# Patient Record
Sex: Female | Born: 1960 | Race: White | Hispanic: No | Marital: Married
Health system: Southern US, Community
[De-identification: ages and names within clinical notes are randomized; demographics above are authoritative.]

---

## 2013-07-18 ENCOUNTER — Other Ambulatory Visit: Payer: Self-pay | Admitting: Family Medicine

## 2013-07-18 ENCOUNTER — Ambulatory Visit (INDEPENDENT_AMBULATORY_CARE_PROVIDER_SITE_OTHER): Payer: BC Managed Care – PPO

## 2013-07-18 DIAGNOSIS — M25539 Pain in unspecified wrist: Secondary | ICD-10-CM

## 2013-07-18 DIAGNOSIS — M79643 Pain in unspecified hand: Secondary | ICD-10-CM

## 2013-07-18 DIAGNOSIS — M79609 Pain in unspecified limb: Secondary | ICD-10-CM

## 2013-08-23 ENCOUNTER — Other Ambulatory Visit: Payer: Self-pay | Admitting: Family Medicine

## 2013-08-23 DIAGNOSIS — Z139 Encounter for screening, unspecified: Secondary | ICD-10-CM

## 2013-08-26 ENCOUNTER — Other Ambulatory Visit: Payer: Self-pay | Admitting: Family Medicine

## 2013-08-26 DIAGNOSIS — Z1231 Encounter for screening mammogram for malignant neoplasm of breast: Secondary | ICD-10-CM

## 2013-09-03 ENCOUNTER — Ambulatory Visit: Payer: BC Managed Care – PPO

## 2013-09-03 ENCOUNTER — Ambulatory Visit (INDEPENDENT_AMBULATORY_CARE_PROVIDER_SITE_OTHER): Payer: BC Managed Care – PPO

## 2013-09-03 DIAGNOSIS — M899 Disorder of bone, unspecified: Secondary | ICD-10-CM

## 2013-09-03 DIAGNOSIS — Z139 Encounter for screening, unspecified: Secondary | ICD-10-CM

## 2013-09-03 DIAGNOSIS — R928 Other abnormal and inconclusive findings on diagnostic imaging of breast: Secondary | ICD-10-CM

## 2013-09-03 DIAGNOSIS — Z1231 Encounter for screening mammogram for malignant neoplasm of breast: Secondary | ICD-10-CM

## 2013-09-03 DIAGNOSIS — M949 Disorder of cartilage, unspecified: Secondary | ICD-10-CM

## 2013-09-03 DIAGNOSIS — Z780 Asymptomatic menopausal state: Secondary | ICD-10-CM

## 2013-09-06 ENCOUNTER — Other Ambulatory Visit: Payer: Self-pay | Admitting: Family Medicine

## 2013-09-06 DIAGNOSIS — R928 Other abnormal and inconclusive findings on diagnostic imaging of breast: Secondary | ICD-10-CM

## 2013-09-11 ENCOUNTER — Encounter (INDEPENDENT_AMBULATORY_CARE_PROVIDER_SITE_OTHER): Payer: Self-pay

## 2013-09-11 ENCOUNTER — Ambulatory Visit
Admission: RE | Admit: 2013-09-11 | Discharge: 2013-09-11 | Disposition: A | Discharge status: Home / Self Care | Payer: BC Managed Care – PPO | Source: Ambulatory Visit | Attending: Family Medicine | Admitting: Family Medicine

## 2013-09-11 DIAGNOSIS — R928 Other abnormal and inconclusive findings on diagnostic imaging of breast: Secondary | ICD-10-CM

## 2013-09-13 ENCOUNTER — Other Ambulatory Visit: Payer: BC Managed Care – PPO

## 2016-02-28 IMAGING — DX DG DXA BONE DENSITY STUDY HL7
3 series · 3 of 3 positions shown · non-contrast
Comparison: None.

CLINICAL DATA: Postmenopausal.

EXAM:
DUAL X-RAY ABSORPTIOMETRY (DXA) FOR BONE MINERAL DENSITY

[view not recorded (1 of 3)]
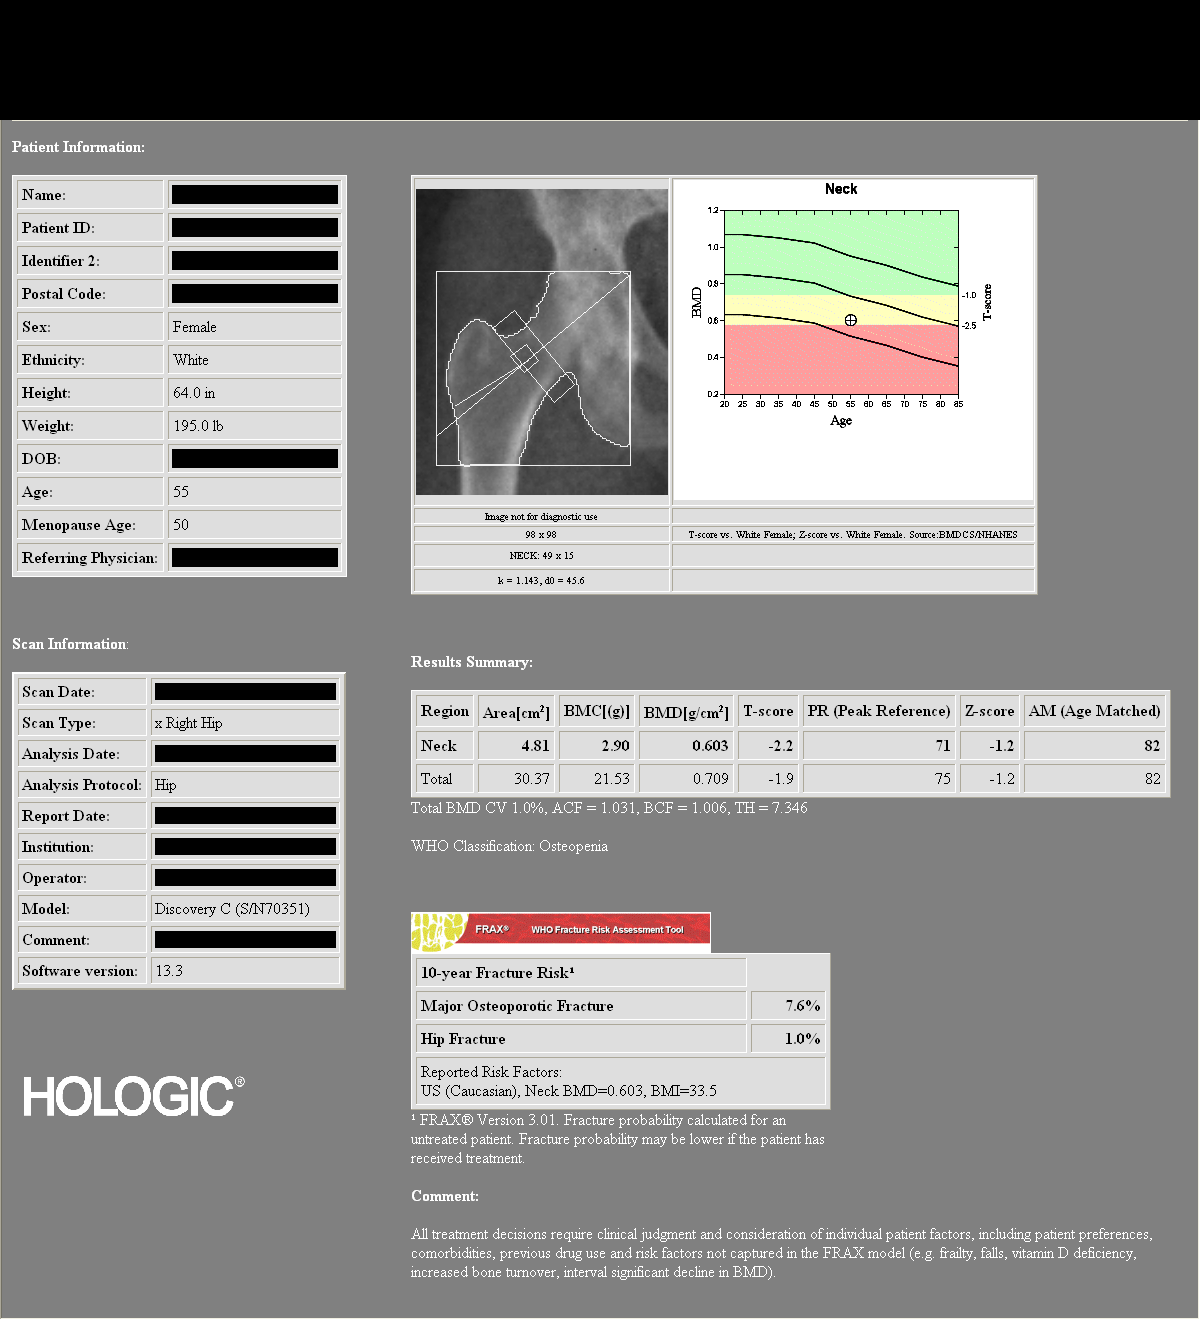

[view not recorded (2 of 3)]
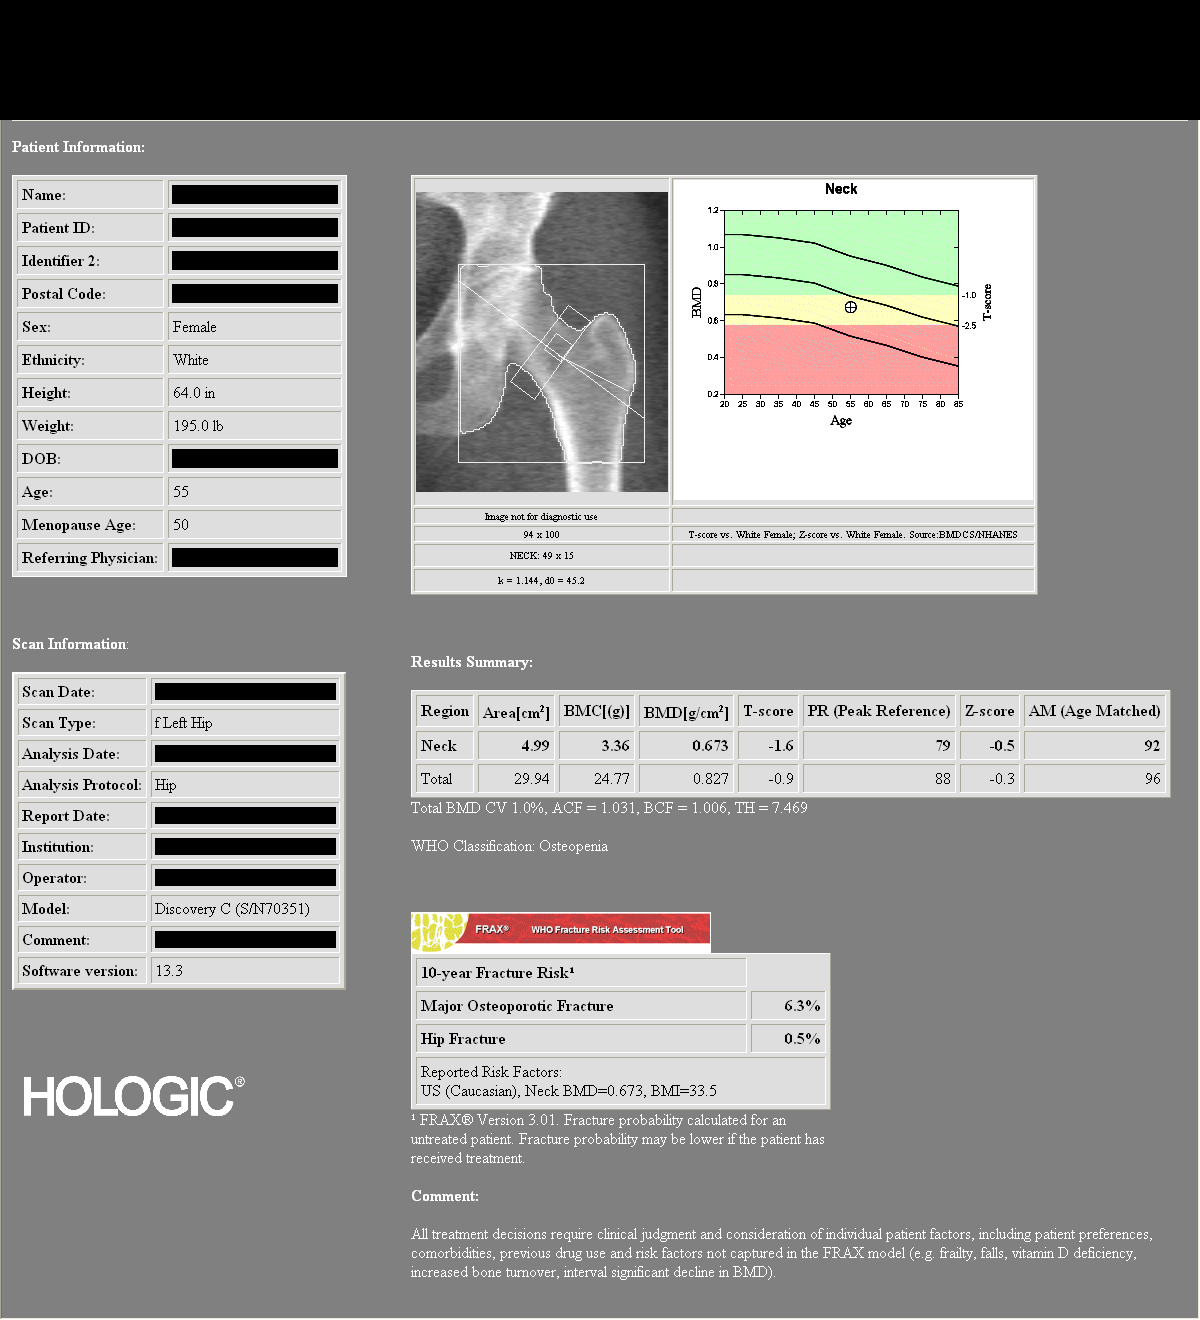

[view not recorded (3 of 3)]
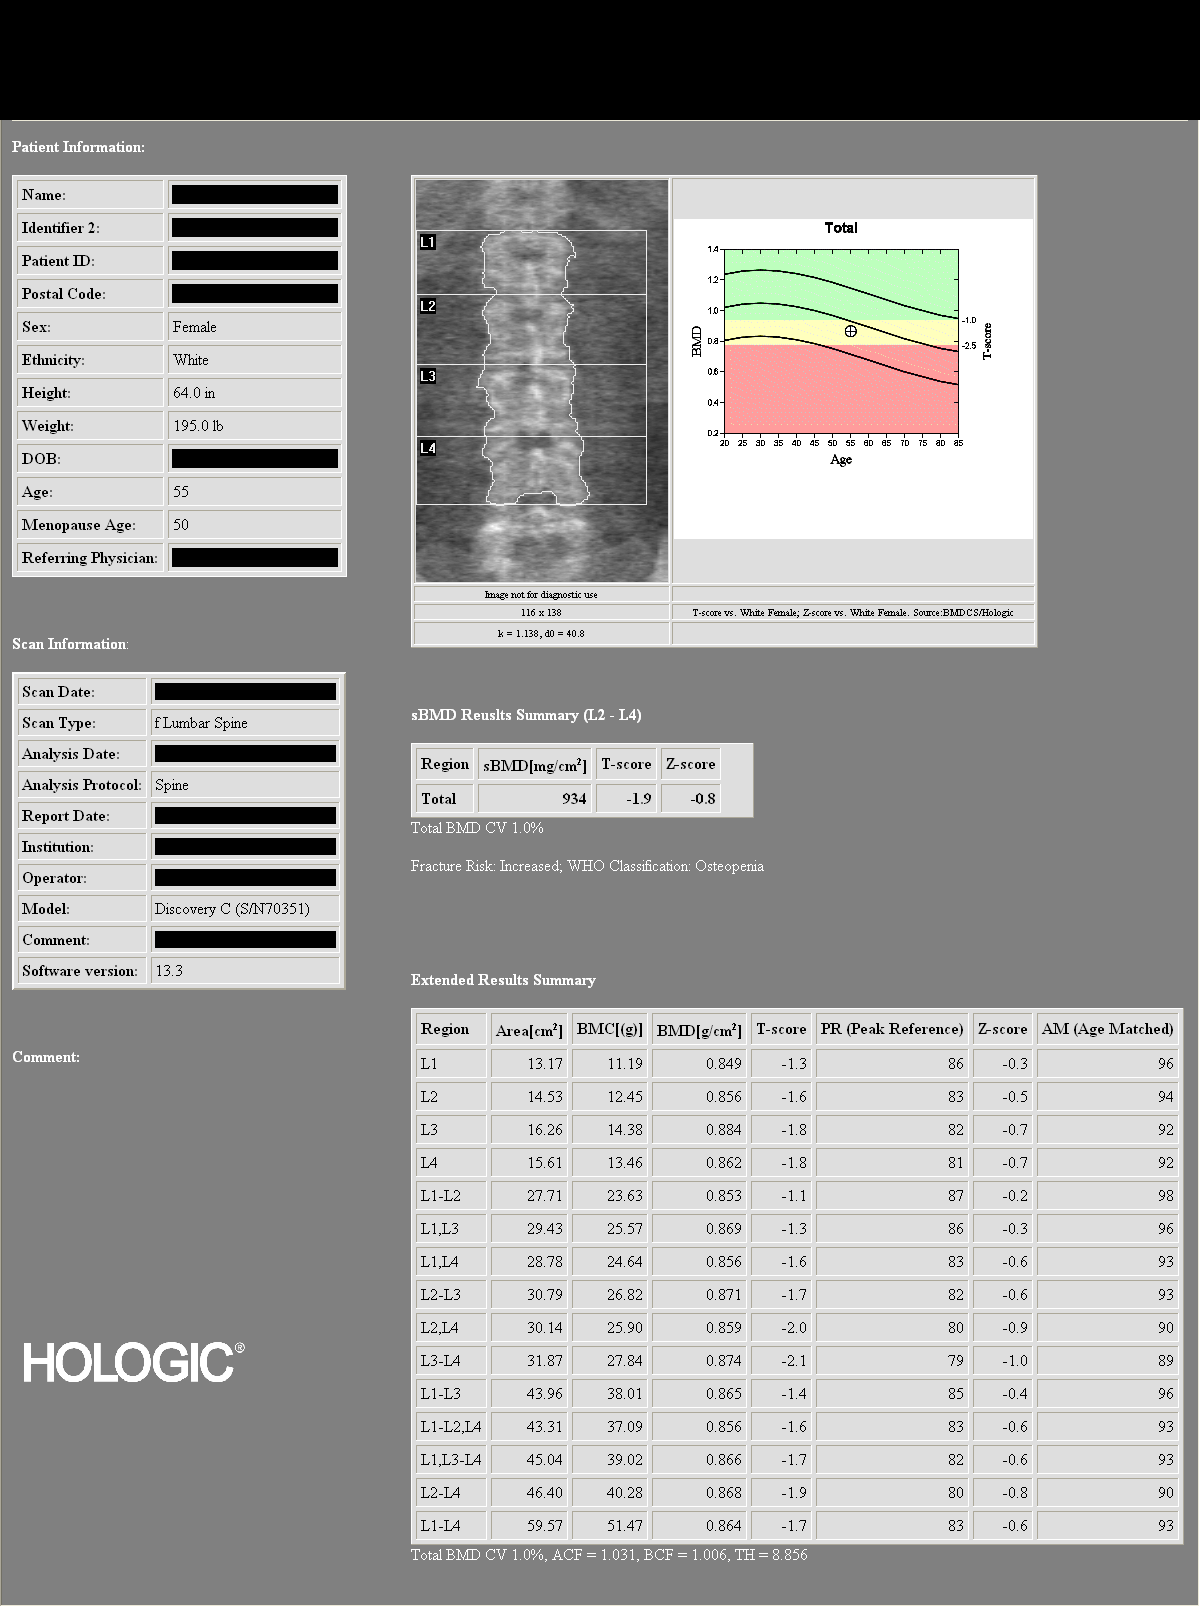

[3 of 3 positions shown; findings below may reference images not displayed]

FINDINGS: AP LUMBAR SPINE

Bone Mineral Density (BMD):  0.864 g/cm2

Young Adult T-Score:  -1.7

Z-Score:  -0.6

LEFT FEMUR NECK

Bone Mineral Density (BMD):  0.673 g/cm2

Young Adult T-Score: -1.6

Z-Score:  -0.5

ASSESSMENT: Patient's diagnostic category is LOW BONE MASS by WHO
Criteria.

FRACTURE RISK:

FRAX: Based on the World Health Organization FRAX model, the 10 year
probability of a major osteoporotic fracture is 6.3%. The 10 year
probability of a hip fracture is 0.5%.
Effective therapies are available in the form of bisphosphonates,
selective estrogen receptor modulators, biologic agents, and hormone
replacement therapy (for women). All patients should ensure an
adequate intake of dietary calcium (9344 mg daily) and vitamin D
(800 Bryce Yeo) unless contraindicated.

All treatment decisions require clinical judgment and consideration
of individual patient factors, including patient preferences,
co-morbidities, previous drug use, risk factors not captured in the
FRAX model (e.g., frailty, falls, vitamin D deficiency, increased
bone turnover, interval significant decline in bone density) and
possible under- or over-estimation of fracture risk by FRAX.

The National Osteoporosis Foundation recommends that FDA-approved
medical therapies be considered in postmenopausal women and men age
50 or older with a:

1. Hip or vertebral (clinical or morphometric) fracture.

2. T-score of -2.5 or lower at the spine or hip.

3. Ten-year fracture probability by FRAX of 3% or greater for hip
fracture or 20% or greater for major osteoporotic fracture.

People with diagnosed cases of osteoporosis or at high risk for
fracture should have regular bone mineral density tests. For
patients eligible for Medicare, routine testing is allowed once
every 2 years. The testing frequency can be increased to one year
for patients who have rapidly progressing disease, those who are
receiving or discontinuing medical therapy to restore bone mass, or
have additional risk factors.

World Health Organization (WHO) Criteria:

Normal: T-scores from +1.0 to -1.0

Low Bone Mass (Osteopenia): T-scores between -1.0 and -2.5

Osteoporosis: T-scores -2.5 and below

Comparison to Reference Population:

T-score is the key measure used in the diagnosis of osteoporosis and
relative risk determination for fracture. It provides a value for
bone mass relative to the mean bone mass of a young adult reference
population expressed in terms of standard deviation (SD).

Z-score is the age-matched score showing the patient's values
compared to a population matched for age, sex, and race. This is
also expressed in terms of standard deviation. The patient may have
values that compare favorably to the age-matched values and still be
at increased risk for fracture.
